# Patient Record
Sex: Male | Born: 1985 | Race: Black or African American | Hispanic: No | Marital: Single | Smoking: Current every day smoker
Health system: Southern US, Community
[De-identification: ages and names within clinical notes are randomized; demographics above are authoritative.]

---
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
## 2014-11-01 ENCOUNTER — Encounter (HOSPITAL_COMMUNITY): Payer: Self-pay | Admitting: *Deleted

## 2014-11-01 ENCOUNTER — Emergency Department (HOSPITAL_COMMUNITY)
Admission: EM | Admit: 2014-11-01 | Discharge: 2014-11-01 | Disposition: A | Discharge status: Home / Self Care | Payer: Self-pay | Attending: Emergency Medicine | Admitting: Emergency Medicine

## 2014-11-01 DIAGNOSIS — L03213 Periorbital cellulitis: Secondary | ICD-10-CM

## 2014-11-01 DIAGNOSIS — H109 Unspecified conjunctivitis: Secondary | ICD-10-CM | POA: Insufficient documentation

## 2014-11-01 DIAGNOSIS — H05012 Cellulitis of left orbit: Secondary | ICD-10-CM | POA: Insufficient documentation

## 2014-11-01 MED ORDER — ERYTHROMYCIN 5 MG/GM OP OINT: TOPICAL_OINTMENT | OPHTHALMIC | Status: DC

## 2014-11-01 MED ORDER — CEPHALEXIN 500 MG PO CAPS: 500.0000 mg | ORAL_CAPSULE | Freq: Four times a day (QID) | ORAL | Status: DC

## 2014-11-01 MED ORDER — SULFAMETHOXAZOLE-TRIMETHOPRIM 800-160 MG PO TABS: 2.0000 | ORAL_TABLET | Freq: Two times a day (BID) | ORAL | Status: DC

## 2014-11-01 NOTE — ED Provider Notes (Signed)
CSN: [id number]     Arrival date & time 11/01/14  0805 History   First MD Initiated Contact with Patient 11/01/14 [phone]     Chief Complaint  Patient presents with  . Eye Problem     (Consider location/radiation/quality/duration/timing/severity/associated sxs/prior Treatment) HPI Comments: Patient presents with a chief complaint of eye redness.  He reports that 2 days ago he woke up with redness of his left eye.  Yesterday the redness had progressed to the right eye.  He reports some associated yellow discharge and crusting of the eyes.  He has not tried any treatment prior to arrival.  He denies eye injury or foreign body.  He denies any eye pain, vision changes, fever, chills, nausea, or vomiting.  He does not wear contact lenses.  Patient is a 28 y.o. male presenting with eye problem. The history is provided by the patient.  Eye Problem   History reviewed. No pertinent past medical history. History reviewed. No pertinent past surgical history. History reviewed. No pertinent family history. History  Substance Use Topics  . Smoking status: Not on file  . Smokeless tobacco: Not on file  . Alcohol Use: Yes     Comment: occ    Review of Systems  All other systems reviewed and are negative.     Allergies  Review of patient's allergies indicates no known allergies.  Home Medications   Prior to Admission medications   Not on File   BP 134/73 mmHg  Pulse 88  Temp(Src) 97.8 F (36.6 C) (Oral)  Resp 18  Ht 5\' 6"  (1.676 m)  Wt 122 lb 3.2 oz (55.43 kg)  BMI 19.73 kg/m2  SpO2 100% Physical Exam  Constitutional: He appears well-developed and well-nourished.  HENT:  Head: Normocephalic and atraumatic.  Mouth/Throat: Oropharynx is clear and moist.  Eyes: EOM are normal. Pupils are equal, round, and reactive to light. Right eye exhibits no discharge. No foreign body present in the right eye. Left eye exhibits no discharge. No foreign body present in the left eye. Right  conjunctiva is injected. Left conjunctiva is injected.  Mild left periorbital swelling.  No pain with EOM.   Visual Acuity  Right Eye Distance: 15/20 Left Eye Distance: 15/20 Bilateral Distance: 15/20  Right Eye Near: R Near: 20/20 Left Eye Near:  L Near: 20/20 Bilateral Near:  20/20   Neck: Normal range of motion. Neck supple.  Cardiovascular: Normal rate, regular rhythm and normal heart sounds.   Pulmonary/Chest: Effort normal and breath sounds normal.  Musculoskeletal: Normal range of motion.  Neurological: He is alert.  Skin: Skin is warm and dry.  Psychiatric: He has a normal mood and affect.  Nursing note and vitals reviewed.   ED Course  Procedures (including critical care time) Labs Review Labs Reviewed - No data to display  Imaging Review No results found.   EKG Interpretation None      MDM   Final diagnoses:  None   Patient with signs and symptoms consistent with Acute Conjunctivitis.  No vision changes.  Normal visual acuity.  Will treat with Erythromycin ointment.  Patient also with mild periorbital swelling and erythema.  No proptosis. No pain with EOM.  He is afebrile.  Will treat for possible Periorbital Cellulitis.  Patient stable for discharge.  Given referral to Ophthalmology.  Return precautions given.      Santiago GladHeather Finnegan Gatta, PA-C 11/02/14 2227  Enid SkeensJoshua M Zavitz, MD 11/03/14 [phone]

## 2014-11-01 NOTE — ED Notes (Signed)
Pt reports redness and swelling to both eyes, more severe in left eye. Denies vision changes or itching.

## 2015-02-06 ENCOUNTER — Encounter (HOSPITAL_COMMUNITY): Payer: Self-pay | Admitting: Emergency Medicine

## 2015-02-06 ENCOUNTER — Emergency Department (HOSPITAL_COMMUNITY)
Admission: EM | Admit: 2015-02-06 | Discharge: 2015-02-06 | Disposition: A | Discharge status: Home / Self Care | Payer: Self-pay | Attending: Emergency Medicine | Admitting: Emergency Medicine

## 2015-02-06 DIAGNOSIS — R369 Urethral discharge, unspecified: Secondary | ICD-10-CM | POA: Insufficient documentation

## 2015-02-06 DIAGNOSIS — R300 Dysuria: Secondary | ICD-10-CM | POA: Insufficient documentation

## 2015-02-06 LAB — URINE MICROSCOPIC-ADD ON

## 2015-02-06 LAB — URINALYSIS, ROUTINE W REFLEX MICROSCOPIC
Bilirubin Urine: NEGATIVE
Glucose, UA: NEGATIVE mg/dL
Ketones, ur: NEGATIVE mg/dL
Nitrite: NEGATIVE
Protein, ur: NEGATIVE mg/dL
Specific Gravity, Urine: 1.021 (ref 1.005–1.030)
Urobilinogen, UA: 1 mg/dL (ref 0.0–1.0)
pH: 7.5 (ref 5.0–8.0)

## 2015-02-06 MED ORDER — DOXYCYCLINE HYCLATE 100 MG PO CAPS: 100.0000 mg | ORAL_CAPSULE | Freq: Two times a day (BID) | ORAL | Status: AC

## 2015-02-06 MED ORDER — AZITHROMYCIN 250 MG PO TABS
1000.0000 mg | ORAL_TABLET | Freq: Once | ORAL | Status: AC
Start: 2015-02-06 — End: 2015-02-06
  Administered 2015-02-06: 1000 mg via ORAL
  Filled 2015-02-06: qty 4

## 2015-02-06 MED ORDER — LIDOCAINE HCL (PF) 1 % IJ SOLN: 5.0000 mL | Freq: Once | INTRAMUSCULAR | Status: DC

## 2015-02-06 MED ORDER — CEFTRIAXONE SODIUM 250 MG IJ SOLR
250.0000 mg | Freq: Once | INTRAMUSCULAR | Status: AC
  Administered 2015-02-06: 250 mg via INTRAMUSCULAR
  Filled 2015-02-06: qty 250

## 2015-02-06 MED ORDER — LIDOCAINE HCL (PF) 1 % IJ SOLN
INTRAMUSCULAR | Status: AC
  Administered 2015-02-06: 5 mL
  Filled 2015-02-06: qty 5

## 2015-02-06 NOTE — Discharge Instructions (Signed)

## 2015-02-06 NOTE — ED Provider Notes (Signed)
CSN: [id number]     Arrival date & time 02/06/15  2038 History  This chart was scribed for non-physician practitioner, Fayrene Helper, PA-C working with Lorre Nick, MD by Angelene Giovanni, ED Scribe. The patient was seen in room TR10C/TR10C and the patient's care was started at 10:32 PM    Chief Complaint  Patient presents with  . Penile Discharge   The history is provided by the patient. No language interpreter was used.   HPI Comments: Clarence Matthews is a 29 y.o. male who presents to the Emergency Department complaining of penile discharge onset this morning when he woke up. He reports associated burning and painful urination onset yesterday. He also reports a left inguinal lump onset today. He denies any fever or urinary frequency. He denies similar symptoms in the past but states that he had an STD when he was 23 but does not remember which one. He denies any new sexual partners and adds that he has been with the same sexual partner for 2 years. He reports NKDA.   History reviewed. No pertinent past medical history. History reviewed. No pertinent past surgical history. No family history on file. History  Substance Use Topics  . Smoking status: Never Smoker   . Smokeless tobacco: Not on file  . Alcohol Use: Yes     Comment: occ    Review of Systems  Constitutional: Negative for fever.  Genitourinary: Positive for dysuria and discharge. Negative for frequency, penile swelling and scrotal swelling.      Allergies  Review of patient's allergies indicates no known allergies.  Home Medications   Prior to Admission medications   Medication Sig Start Date End Date Taking? Authorizing Provider  cephALEXin (KEFLEX) 500 MG capsule Take 1 capsule (500 mg total) by mouth 4 (four) times daily. Patient not taking: Reported on 02/06/2015 11/01/14   Santiago Glad, PA-C  erythromycin ophthalmic ointment Place a 1/2 inch ribbon of ointment into the lower eyelid.  Use four times daily for 5  days. Patient not taking: Reported on 02/06/2015 11/01/14   Santiago Glad, PA-C  sulfamethoxazole-trimethoprim (SEPTRA DS) 800-160 MG per tablet Take 2 tablets by mouth 2 (two) times daily. Patient not taking: Reported on 02/06/2015 11/01/14   Heather Laisure, PA-C   BP 132/81 mmHg  Pulse 67  Temp(Src) 98.9 F (37.2 C) (Oral)  Resp 14  Ht  (1.676 m)  Wt 130 lb (58.968 kg)  BMI 20.99 kg/m2  SpO2 100% Physical Exam  Constitutional: He is oriented to person, place, and time. He appears well-developed and well-nourished. No distress.  HENT:  Head: Normocephalic and atraumatic.  Eyes: Conjunctivae and EOM are normal.  Neck: Neck supple. No tracheal deviation present.  Cardiovascular: Normal rate.   Pulmonary/Chest: Effort normal. No respiratory distress.  Abdominal: Hernia confirmed negative in the right inguinal area and confirmed negative in the left inguinal area.  Genitourinary: Rectum normal. Right testis shows no swelling. Left testis shows no swelling. Circumcised. Discharge found.  Left inguinal lymphadenopathy    Testicle non tender Penis circumcised with active discharge No penile rash or legion.    Musculoskeletal: Normal range of motion.  Neurological: He is alert and oriented to person, place, and time.  Skin: Skin is warm and dry.  Psychiatric: He has a normal mood and affect. His behavior is normal.  Nursing note and vitals reviewed.   ED Course  Procedures (including critical care time)\ DIAGNOSTIC STUDIES: Oxygen Saturation is 100% on RA, normal by my interpretation.  COORDINATION OF CARE: 10:36 PM- Pt with penile discharge. Pt will be tested for an STD and advised to share results with sexual partner and urge to get tested if tested positive.  Avoid sexual activities until sxs resolved. Pt advised of plan for treatment and pt agrees.    Labs Review Labs Reviewed  URINALYSIS, ROUTINE W REFLEX MICROSCOPIC - Abnormal; Notable for the following:     APPearance CLOUDY (*)    Hgb urine dipstick TRACE (*)    Leukocytes, UA MODERATE (*)    All other components within normal limits  URINE MICROSCOPIC-ADD ON    Imaging Review No results found.   EKG Interpretation None      MDM   Final diagnoses:  Abnormal penile discharge    BP 132/81 mmHg  Pulse 67  Temp(Src) 98.9 F (37.2 C) (Oral)  Resp 14  Ht 5\' 6"  (1.676 m)  Wt 130 lb (58.968 kg)  BMI 20.99 kg/m2  SpO2 100%   I personally performed the services described in this documentation, which was scribed in my presence. The recorded information has been reviewed and is accurate.    Fayrene HelperBowie Kyelle Urbas, PA-C 02/06/15 2302  Lorre NickAnthony Allen, MD 02/09/15 [phone]

## 2015-02-06 NOTE — ED Notes (Signed)
Pt presents with painful urination yesterday, this morning he noticed white discharge from penis.  Denies new sexual partners.

## 2015-02-07 ENCOUNTER — Telehealth (HOSPITAL_BASED_OUTPATIENT_CLINIC_OR_DEPARTMENT_OTHER): Payer: Self-pay | Admitting: Emergency Medicine

## 2015-02-07 LAB — RPR: RPR: NONREACTIVE

## 2015-02-07 LAB — HIV ANTIBODY (ROUTINE TESTING W REFLEX): HIV SCREEN 4TH GENERATION: NONREACTIVE

## 2015-02-08 ENCOUNTER — Telehealth: Payer: Self-pay | Admitting: Surgery

## 2015-02-08 NOTE — Telephone Encounter (Signed)
Late Entry 02/08/15 18:57 ED CM [facility] on Doxy prescription. Discussed with Felicita GageJ. Geiple clarified frequency,  Read aloud 100 mg twice daily x 7 days. No further ED CM needs identified

## 2015-04-11 ENCOUNTER — Encounter (HOSPITAL_COMMUNITY): Payer: Self-pay | Admitting: *Deleted

## 2015-04-11 ENCOUNTER — Emergency Department (HOSPITAL_COMMUNITY)
Admission: EM | Admit: 2015-04-11 | Discharge: 2015-04-11 | Disposition: A | Discharge status: Home / Self Care | Payer: Self-pay | Attending: Emergency Medicine | Admitting: Emergency Medicine

## 2015-04-11 DIAGNOSIS — Z792 Long term (current) use of antibiotics: Secondary | ICD-10-CM | POA: Insufficient documentation

## 2015-04-11 DIAGNOSIS — Z202 Contact with and (suspected) exposure to infections with a predominantly sexual mode of transmission: Secondary | ICD-10-CM | POA: Insufficient documentation

## 2015-04-11 DIAGNOSIS — R369 Urethral discharge, unspecified: Secondary | ICD-10-CM | POA: Insufficient documentation

## 2015-04-11 DIAGNOSIS — Z720 Tobacco use: Secondary | ICD-10-CM | POA: Insufficient documentation

## 2015-04-11 MED ORDER — CEFTRIAXONE SODIUM 250 MG IJ SOLR
250.0000 mg | Freq: Once | INTRAMUSCULAR | Status: AC
  Administered 2015-04-11: 250 mg via INTRAMUSCULAR
  Filled 2015-04-11: qty 250

## 2015-04-11 MED ORDER — AZITHROMYCIN 250 MG PO TABS
1000.0000 mg | ORAL_TABLET | Freq: Once | ORAL | Status: AC
  Administered 2015-04-11: 1000 mg via ORAL
  Filled 2015-04-11: qty 4

## 2015-04-11 MED ORDER — LIDOCAINE HCL (PF) 1 % IJ SOLN
INTRAMUSCULAR | Status: AC
  Filled 2015-04-11: qty 5

## 2015-04-11 NOTE — ED Notes (Signed)
Pt. reports penile discharge onset this week , denies dysuria , no fever or chills.

## 2015-04-11 NOTE — ED Provider Notes (Signed)
CSN: [id number]     Arrival date & time 04/11/15  2045 History  This chart was scribed for Clarence DredgeEmily Ronnie Mallette, PA-C working with Clarence Lyonsouglas Delo, MD by Evon Slackerrance Branch, ED Scribe. This patient was seen in room TR05C/TR05C and the patient's care was started at 9:20 PM.      Chief Complaint  Patient presents with  . SEXUALLY TRANSMITTED DISEASE  . Penile Discharge   The history is provided by the patient. No language interpreter was used.   HPI Comments: Clarence LenzMario A Matthews is a 29 y.o. male who presents to the Emergency Department complaining of yellow penile discharge onset 2 days prior. Pt that his girlfriends recently told him she was diagnosed with chlamydia. Pt states that 1 month previously his girlfriend had been previously diagnosed with chlamydia and was never rechecked until a few days ago and was told she still had chlamydia.  Pt states that he has a Hx of chlamydia 1 month prior. Denies fevers, chills, dysuria, testicle pain, testicle swelling or abdominal pain.  States he took the antibiotics as directed and he and his girlfriend abstained from sex for about 1 week.    History reviewed. No pertinent past medical history. History reviewed. No pertinent past surgical history. History reviewed. No pertinent family history. History  Substance Use Topics  . Smoking status: Current Every Day Smoker  . Smokeless tobacco: Not on file  . Alcohol Use: Yes     Comment: occ    Review of Systems  Constitutional: Negative for fever and chills.  Gastrointestinal: Negative for nausea, vomiting and abdominal pain.  Genitourinary: Positive for discharge. Negative for dysuria, penile swelling, scrotal swelling, penile pain and testicular pain.  Musculoskeletal: Negative for myalgias and neck stiffness.  Skin: Negative for rash.  Allergic/Immunologic: Negative for immunocompromised state.  Hematological: Does not bruise/bleed easily.  Psychiatric/Behavioral: Negative for self-injury.      Allergies   Review of patient's allergies indicates no known allergies.  Home Medications   Prior to Admission medications   Medication Sig Start Date End Date Taking? Authorizing Provider  cephALEXin (KEFLEX) 500 MG capsule Take 1 capsule (500 mg total) by mouth 4 (four) times daily. Patient not taking: Reported on 02/06/2015 11/01/14   Santiago GladHeather Laisure, PA-C  doxycycline (VIBRAMYCIN) 100 MG capsule Take 1 capsule (100 mg total) by mouth 2 (two) times daily. One po bid x 7 days 02/06/15   Fayrene HelperBowie Tran, PA-C  erythromycin ophthalmic ointment Place a 1/2 inch ribbon of ointment into the lower eyelid.  Use four times daily for 5 days. Patient not taking: Reported on 02/06/2015 11/01/14   Santiago GladHeather Laisure, PA-C  sulfamethoxazole-trimethoprim (SEPTRA DS) 800-160 MG per tablet Take 2 tablets by mouth 2 (two) times daily. Patient not taking: Reported on 02/06/2015 11/01/14   Heather Laisure, PA-C   BP 125/78 mmHg  Pulse 63  Temp(Src) 98.8 F (37.1 C)  Resp 20  SpO2 100%   Physical Exam  Constitutional: He appears well-developed and well-nourished. No distress.  HENT:  Head: Normocephalic and atraumatic.  Eyes: Conjunctivae are normal.  Neck: Normal range of motion. Neck supple.  Pulmonary/Chest: Effort normal.  Musculoskeletal: Normal range of motion.  Neurological: He is alert. He exhibits normal muscle tone.  Skin: He is not diaphoretic.  Psychiatric: He has a normal mood and affect. His behavior is normal. Thought content normal.  Nursing note and vitals reviewed.   ED Course  Procedures (including critical care time) DIAGNOSTIC STUDIES: Oxygen Saturation is 100% on RA, normal by  my interpretation.    COORDINATION OF CARE: 9:52 PM-Discussed treatment plan with pt at bedside and pt agreed to plan.    Labs Review Labs Reviewed - No data to display  Imaging Review No results found.   EKG Interpretation None      MDM   Final diagnoses:  Abnormal penile discharge  Exposure to  chlamydia   Afebrile, nontoxic patient with known exposure to chlamydia - he and girlfriend both treated previously but girlfriend tested positive again.  Chart reviewed.  Pt had urine sent but no results for GC/Chlam available.  HIV, RPR negative.  Pt given rocephin, azithromycin, and doxycycline at home.  Given girlfriend's persistent positive chlamydia test, pt requests treatment.  Declines genital swab at this time.  Rocephin, azithromycin given in ED.   D/C home with health department follow up.  Discussed result, findings, treatment, and follow up  with patient.  Pt given return precautions.  Pt verbalizes understanding and agrees with plan.        I personally performed the services described in this documentation, which was scribed in my presence. The recorded information has been reviewed and is accurate.      Clarence Dredge, PA-C 04/11/15 2247  Clarence Lyons, MD 04/12/15 [phone]

## 2015-04-11 NOTE — ED Notes (Signed)
Pt A&Ox4, ambulatory at d/c with steady gait, NAD 

## 2015-04-11 NOTE — Discharge Instructions (Signed)
Read the information below.  You may return to the Emergency Department at any time for worsening condition or any new symptoms that concern you. ° ° °Sexually Transmitted Disease °A sexually transmitted disease (STD) is a disease or infection that may be passed (transmitted) from person to person, usually during sexual activity. This may happen by way of saliva, semen, blood, vaginal mucus, or urine. Common STDs include:  °· Gonorrhea.   °· Chlamydia.   °· Syphilis.   °· HIV and AIDS.   °· Genital herpes.   °· Hepatitis B and C.   °· Trichomonas.   °· Human papillomavirus (HPV).   °· Pubic lice.   °· Scabies. °· Mites. °· Bacterial vaginosis. °WHAT ARE CAUSES OF STDs? °An STD may be caused by bacteria, a virus, or parasites. STDs are often transmitted during sexual activity if one person is infected. However, they may also be transmitted through nonsexual means. STDs may be transmitted after:  °· Sexual intercourse with an infected person.   °· Sharing sex toys with an infected person.   °· Sharing needles with an infected person or using unclean piercing or tattoo needles. °· Having intimate contact with the genitals, mouth, or rectal areas of an infected person.   °· Exposure to infected fluids during birth. °WHAT ARE THE SIGNS AND SYMPTOMS OF STDs? °Different STDs have different symptoms. Some people may not have any symptoms. If symptoms are present, they may include:  °· Painful or bloody urination.   °· Pain in the pelvis, abdomen, vagina, anus, throat, or eyes.   °· A skin rash, itching, or irritation. °· Growths, ulcerations, blisters, or sores in the genital and anal areas. °· Abnormal vaginal discharge with or without bad odor.   °· Penile discharge in men.   °· Fever.   °· Pain or bleeding during sexual intercourse.   °· Swollen glands in the groin area.   °· Yellow skin and eyes (jaundice). This is seen with hepatitis.   °· Swollen testicles. °· Infertility. °· Sores and blisters in the mouth. °HOW ARE  STDs DIAGNOSED? °To make a diagnosis, your health care provider may:  °· Take a medical history.   °· Perform a physical exam.   °· Take a sample of any discharge to examine. °· Swab the throat, cervix, opening to the penis, rectum, or vagina for testing. °· Test a sample of your first morning urine.   °· Perform blood tests.   °· Perform a Pap test, if this applies.   °· Perform a colposcopy.   °· Perform a laparoscopy.   °HOW ARE STDs TREATED? ° Treatment depends on the STD. Some STDs may be treated but not cured.  °· Chlamydia, gonorrhea, trichomonas, and syphilis can be cured with antibiotic medicine.   °· Genital herpes, hepatitis, and HIV can be treated, but not cured, with prescribed medicines. The medicines lessen symptoms.   °· Genital warts from HPV can be treated with medicine or by freezing, burning (electrocautery), or surgery. Warts may come back.   °· HPV cannot be cured with medicine or surgery. However, abnormal areas may be removed from the cervix, vagina, or vulva.   °· If your diagnosis is confirmed, your recent sexual partners need treatment. This is true even if they are symptom-free or have a negative culture or evaluation. They should not have sex until their health care providers say it is okay. °HOW CAN I REDUCE MY RISK OF GETTING AN STD? °Take these steps to reduce your risk of getting an STD: °· Use latex condoms, dental dams, and water-soluble lubricants during sexual activity. Do not use petroleum jelly or oils. °· Avoid having multiple sex partners. °·   partners.  Do not have sex with someone who has other sex partners.  Do not have sex with anyone you do not know or who is at high risk for an STD.  Avoid risky sex practices that can break your skin.  Do not have sex if you have open sores on your mouth or skin.  Avoid drinking too much alcohol or taking illegal drugs. Alcohol and drugs can affect your judgment and put you in a vulnerable position.  Avoid engaging in oral and anal  sex acts.  Get vaccinated for HPV and hepatitis. If you have not received these vaccines in the past, talk to your health care provider about whether one or both might be right for you.   If you are at risk of being infected with HIV, it is recommended that you take a prescription medicine daily to prevent HIV infection. This is called pre-exposure prophylaxis (PrEP). You are considered at risk if:  You are a man who has sex with other men (MSM).  You are a heterosexual man or woman and are sexually active with more than one partner.  You take drugs by injection.  You are sexually active with a partner who has HIV.  Talk with your health care provider about whether you are at high risk of being infected with HIV. If you choose to begin PrEP, you should first be tested for HIV. You should then be tested every 3 months for as long as you are taking PrEP.  WHAT SHOULD I DO IF I THINK I HAVE AN STD?  See your health care provider.   Tell your sexual partner(s). They should be tested and treated for any STDs.  Do not have sex until your health care provider says it is okay. WHEN SHOULD I GET IMMEDIATE MEDICAL CARE? Contact your health care provider right away if:   You have severe abdominal pain.  You are a man and notice swelling or pain in your testicles.  You are a woman and notice swelling or pain in your vagina. Document Released: 01/22/2003 Document Revised: 11/06/2013 Document Reviewed: 05/22/2013 [facility] Patient Information 2015 [employer] [place]. This information is not intended to replace advice given to you by your health care provider. Make sure you discuss any questions you have with your health care provider.

## 2015-04-11 NOTE — ED Notes (Signed)
Pt c/o penile discharge. Pt states he was informed by his girl friend that she has chlaymidia

## 2020-06-23 ENCOUNTER — Emergency Department (HOSPITAL_COMMUNITY): Payer: Self-pay

## 2020-06-23 ENCOUNTER — Other Ambulatory Visit: Payer: Self-pay

## 2020-06-23 ENCOUNTER — Encounter (HOSPITAL_COMMUNITY): Payer: Self-pay

## 2020-06-23 ENCOUNTER — Emergency Department (HOSPITAL_COMMUNITY)
Admission: EM | Admit: 2020-06-23 | Discharge: 2020-06-23 | Disposition: A | Discharge status: Home / Self Care | Payer: Self-pay | Attending: Emergency Medicine | Admitting: Emergency Medicine

## 2020-06-23 DIAGNOSIS — S63280A Dislocation of proximal interphalangeal joint of right index finger, initial encounter: Secondary | ICD-10-CM | POA: Insufficient documentation

## 2020-06-23 DIAGNOSIS — S63259A Unspecified dislocation of unspecified finger, initial encounter: Secondary | ICD-10-CM

## 2020-06-23 DIAGNOSIS — Y998 Other external cause status: Secondary | ICD-10-CM | POA: Insufficient documentation

## 2020-06-23 DIAGNOSIS — W231XXA Caught, crushed, jammed, or pinched between stationary objects, initial encounter: Secondary | ICD-10-CM | POA: Insufficient documentation

## 2020-06-23 DIAGNOSIS — Y92810 Car as the place of occurrence of the external cause: Secondary | ICD-10-CM | POA: Insufficient documentation

## 2020-06-23 DIAGNOSIS — Y93E3 Activity, vacuuming: Secondary | ICD-10-CM | POA: Insufficient documentation

## 2020-06-23 MED ORDER — ACETAMINOPHEN 500 MG PO TABS
1000.0000 mg | ORAL_TABLET | Freq: Once | ORAL | Status: AC
  Administered 2020-06-23: 1000 mg via ORAL
  Filled 2020-06-23: qty 2

## 2020-06-23 MED ORDER — LIDOCAINE HCL (PF) 1 % IJ SOLN
20.0000 mL | Freq: Once | INTRAMUSCULAR | Status: AC
  Administered 2020-06-23: 20 mL
  Filled 2020-06-23: qty 20

## 2020-06-23 NOTE — ED Notes (Signed)
Pt reports his son accidentally shut his R index finger in the car door when they were vacuuming out the car.

## 2020-06-23 NOTE — ED Triage Notes (Addendum)
Pt reports his son slammed his finger in the car door approx 1.5 hours ago. Obvious swelling and deformity to his Right index finger

## 2020-06-23 NOTE — ED Provider Notes (Signed)
[facility] EMERGENCY DEPARTMENT Provider Note   CSN: [id number] Arrival date & time: 06/23/20  1144     History Chief Complaint  Patient presents with   Finger Injury    Clarence Matthews is a 34 y.o. male.  HPI Patient is a 34 year old male with no pertinent past medical history presented today for right index finger pain at the mid finger that occurred immediately after his finger was slammed in a car door at approximately 11 AM this morning.  He states the pain is been constant, worse with touch achy in quality nonradiating.  He states that he has no lacerations or cuts.  He denies any other injuries.  He states that he cannot move the finger very well.  He has good sensation however.    History reviewed. No pertinent past medical history.  There are no problems to display for this patient.   History reviewed. No pertinent surgical history.     History reviewed. No pertinent family history.  Social History   Tobacco Use   Smoking status: Current Every Day Smoker   Smokeless tobacco: Never Used  Substance Use Topics   Alcohol use: Yes    Comment: occ   Drug use: No    Home Medications Prior to Admission medications   Medication Sig Start Date End Date Taking? Authorizing Provider  cephALEXin (KEFLEX) 500 MG capsule Take 1 capsule (500 mg total) by mouth 4 (four) times daily. Patient not taking: Reported on 02/06/2015 11/01/14   Santiago Glad, PA-C  doxycycline (VIBRAMYCIN) 100 MG capsule Take 1 capsule (100 mg total) by mouth 2 (two) times daily. One po bid x 7 days 02/06/15   Fayrene Helper, PA-C  erythromycin ophthalmic ointment Place a 1/2 inch ribbon of ointment into the lower eyelid.  Use four times daily for 5 days. Patient not taking: Reported on 02/06/2015 11/01/14   Santiago Glad, PA-C  sulfamethoxazole-trimethoprim (SEPTRA DS) 800-160 MG per tablet Take 2 tablets by mouth 2 (two) times daily. Patient not taking: Reported on 02/06/2015  11/01/14   Santiago Glad, PA-C    Allergies    Patient has no known allergies.  Review of Systems   Review of Systems  Constitutional: Negative for chills and fever.  HENT: Negative for congestion.   Respiratory: Negative for shortness of breath.   Cardiovascular: Negative for chest pain.  Gastrointestinal: Negative for abdominal pain.  Musculoskeletal: Negative for neck pain.       Right index finger pain    Physical Exam Updated Vital Signs BP (!) 146/100 (BP Location: Right Arm)    Pulse 60    Temp 98.7 F (37.1 C) (Oral)    Resp 18    SpO2 100%   Physical Exam Vitals and nursing note reviewed.  Constitutional:      General: He is not in acute distress.    Appearance: Normal appearance. He is not ill-appearing.  HENT:     Head: Normocephalic and atraumatic.  Eyes:     General: No scleral icterus.       Right eye: No discharge.        Left eye: No discharge.     Conjunctiva/sclera: Conjunctivae normal.  Pulmonary:     Effort: Pulmonary effort is normal.     Breath sounds: No stridor.  Musculoskeletal:     Comments: Obvious deformity of the right index finger at the PIP with dorsal dislocation of the finger at the PIP joint.  Unable to range finger.  Skin:    General: Skin is warm.     Capillary Refill: Capillary refill takes less than 2 seconds.     Comments: Cap refill is less than 2 seconds.  Neurological:     Mental Status: He is alert and oriented to person, place, and time. Mental status is at baseline.     Comments: Sensation intact in all fingertips.     ED Results / Procedures / Treatments   Labs (all labs ordered are listed, but only abnormal results are displayed) Labs Reviewed - No data to display  EKG None  Radiology DG Hand Complete Right  Result Date: 06/23/2020 CLINICAL DATA:  Pain after car door slammed on second digit EXAM: RIGHT HAND - COMPLETE 3+ VIEW COMPARISON:  None. FINDINGS: Frontal, oblique, and lateral views were obtained.  There is dislocation at the second PIP joint with dorsal displacement of the middle and distal second phalanges with respect to the proximal phalanx. There is a small avulsion along the volar aspect of the distal most aspect of the second proximal phalanx. No other fractures or dislocations are appreciable. Joint spaces appear normal. No erosive change. IMPRESSION: 1. Dislocation at the second PIP joint with dorsal displacement of the middle and distal phalanges of the second digit compared to the proximal phalanx. 2. Small avulsion arising from the volar aspect of the distal aspect of second proximal phalanx. 3.  No other fracture or dislocation.  No appreciable arthropathy. Electronically Signed   By: Bretta Bang III M.D.   On: 06/23/2020 13:16    Procedures Reduction of dislocation  Date/Time: 06/23/2020 2:53 PM Performed by: Gailen Shelter, PA Authorized by: Gailen Shelter, PA  Local anesthesia used: yes Anesthesia: digital block  Anesthesia: Local anesthesia used: yes Local Anesthetic: lidocaine 1% without epinephrine Anesthetic total: 10 mL  Sedation: Patient sedated: no  Patient tolerance: patient tolerated the procedure well with no immediate complications Comments: Patient finger was prepped and cleaned.  Please see digital block procedure note for details of digital block.  Anesthesia was successfully obtained.  Patient agreed to proceed with relocation of dislocation of right index finger at the PIP.  It was dorsally displaced initially and was returned to normal anatomical position.  Celedonio Miyamoto Block  Date/Time: 06/23/2020 2:54 PM Performed by: Gailen Shelter, PA Authorized by: Gailen Shelter, PA   Consent:    Consent obtained:  Verbal   Consent given by:  Patient   Risks discussed:  Unsuccessful block, nerve damage, infection, intravenous injection and pain   Alternatives discussed:  No treatment, delayed treatment and referral Indications:    Indications:  Pain  relief and procedural anesthesia Location:    Body area:  Upper extremity   Upper extremity nerve blocked: digital block.   Laterality:  Right (index finger) Pre-procedure details:    Skin preparation:  Alcohol Procedure details (see MAR for exact dosages):    Block needle gauge:  25 G   Anesthetic injected:  Lidocaine 1% w/o epi   Steroid injected:  None   Additive injected:  None   Injection procedure:  Anatomic landmarks identified, incremental injection, negative aspiration for blood and anatomic landmarks palpated Post-procedure details:    Dressing:  Sterile dressing   Outcome:  Anesthesia achieved   Patient tolerance of procedure:  Tolerated well, no immediate complications   (including critical care time)  Medications Ordered in ED Medications  acetaminophen (TYLENOL) tablet 1,000 mg (1,000 mg Oral Given 06/23/20 1358)  lidocaine (PF) (XYLOCAINE)  1 % injection 20 mL (20 mLs Infiltration Given by Other 06/23/20 1358)    ED Course  I have reviewed the triage vital signs and the nursing notes.  Pertinent labs & imaging results that were available during my care of the patient were reviewed by me and considered in my medical decision making (see chart for details).    MDM Rules/Calculators/A&P                          Patient is a 39-year-old male presented today with right index finger pain after having his finger slammed in a car door.  Physical exam is notable for a good cap refill.  Good sensation.  Obvious deformity with dorsal displacement of middle and distal phalanges.  X-ray confirms dorsal dislocation of middle phalanx.  Transition my care physician patient he is agreeable to relocation of dislocation today.  Please see procedure note for full details.  Successful relocation of finger.  Patient was given 1 dose of Tylenol for pain which improved his pain.  His symptoms were significantly improved with relocation of the joint.  He was placed in splint and had  postreduction x-rays taken which show anatomical position of finger.  He will follow-up with hand surgery for reassessment.  Will wear splint until told to remove it by hand surgery.  Patient still has some effects of digital block after reduction however seems to be grossly distally neurovascularly intact and has good cap refill and movement.  I discussed this case with my attending physician who cosigned this note including patient's presenting symptoms, physical exam, and planned diagnostics and interventions. Attending physician stated agreement with plan or made changes to plan which were implemented.   Very small avulsion fracture noted on dorsum of proximal phalanx at the distal aspect.  Final Clinical Impression(s) / ED Diagnoses Final diagnoses:  Dislocation of finger, initial encounter  Dislocation of proximal interphalangeal joint of right index finger, initial encounter    Rx / DC Orders ED Discharge Orders    None       Gailen Shelter, [place] 06/23/20 1459    Pricilla Loveless, MD 06/23/20 1609

## 2020-06-23 NOTE — Discharge Instructions (Addendum)
Your right index finger was dislocated today.  There is a tiny avulsion fracture noted on the x-ray.  I am placing you in a metallic splint which you should continue to wear until you are seen by hand surgeon.  I provided you the information for hand surgeon in our system.  Please call to make an appointment with them.  Please use Tylenol and ibuprofen for pain.  I have provided you with the dosing information below.  Please use Tylenol or ibuprofen for pain.  You may use 600 mg ibuprofen every 6 hours or 1000 mg of Tylenol every 6 hours.  You may choose to alternate between the 2.  This would be most effective.  Not to exceed 4 g of Tylenol within 24 hours.  Not to exceed 3200 mg ibuprofen 24 hours.

## 2020-06-23 NOTE — ED Notes (Signed)
Pt verbalized understanding of discharge instructions. Follow up care and pain management reviewed, pt had no further questions. 

## 2020-06-23 NOTE — ED Notes (Signed)
Patient transported to X-ray 

## 2020-07-23 ENCOUNTER — Other Ambulatory Visit: Payer: Self-pay

## 2022-02-02 IMAGING — DX DG HAND COMPLETE 3+V*R*
4 series · 4 of 4 positions shown · non-contrast
Comparison: None.

CLINICAL DATA: Pain after car door slammed on second digit

EXAM:
RIGHT HAND - COMPLETE 3+ VIEW

[x hand pa right]
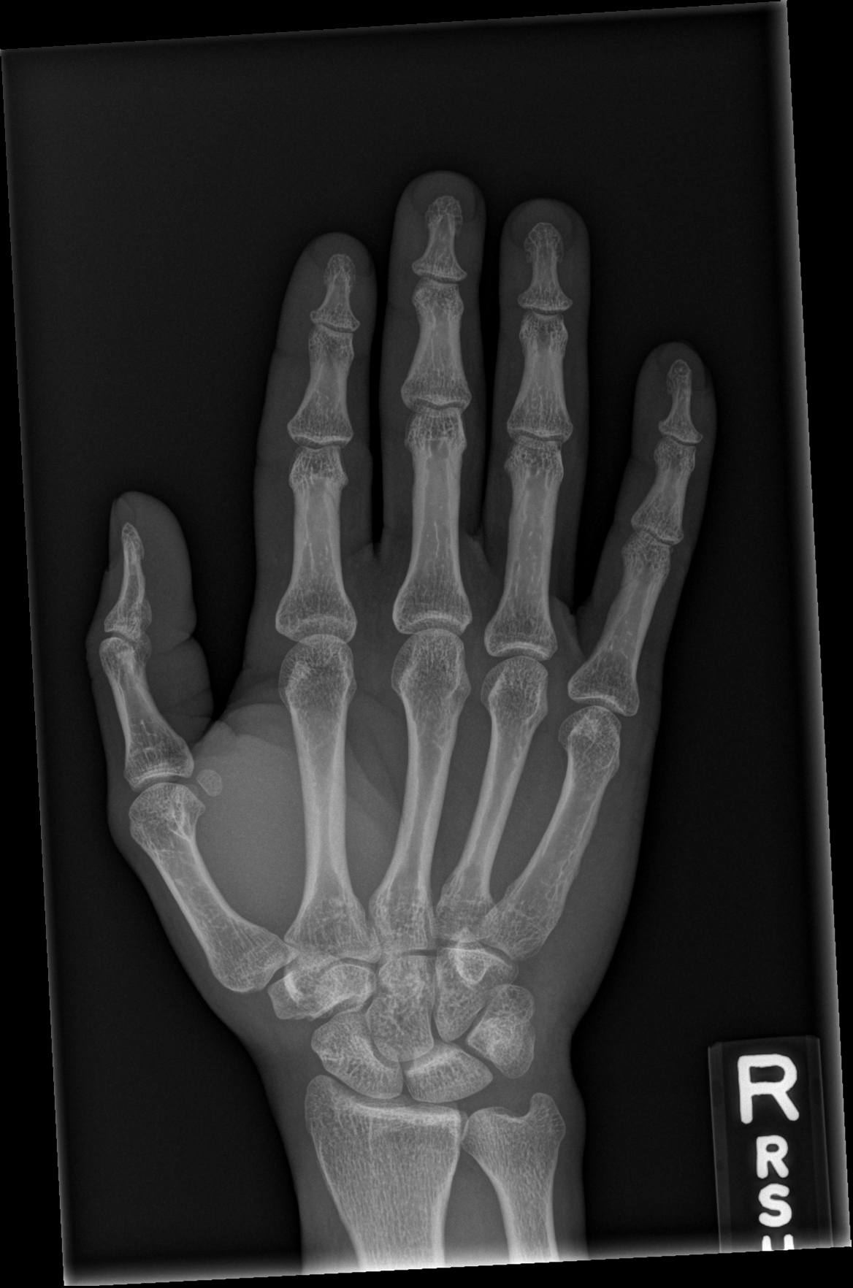

[x hand obl right]
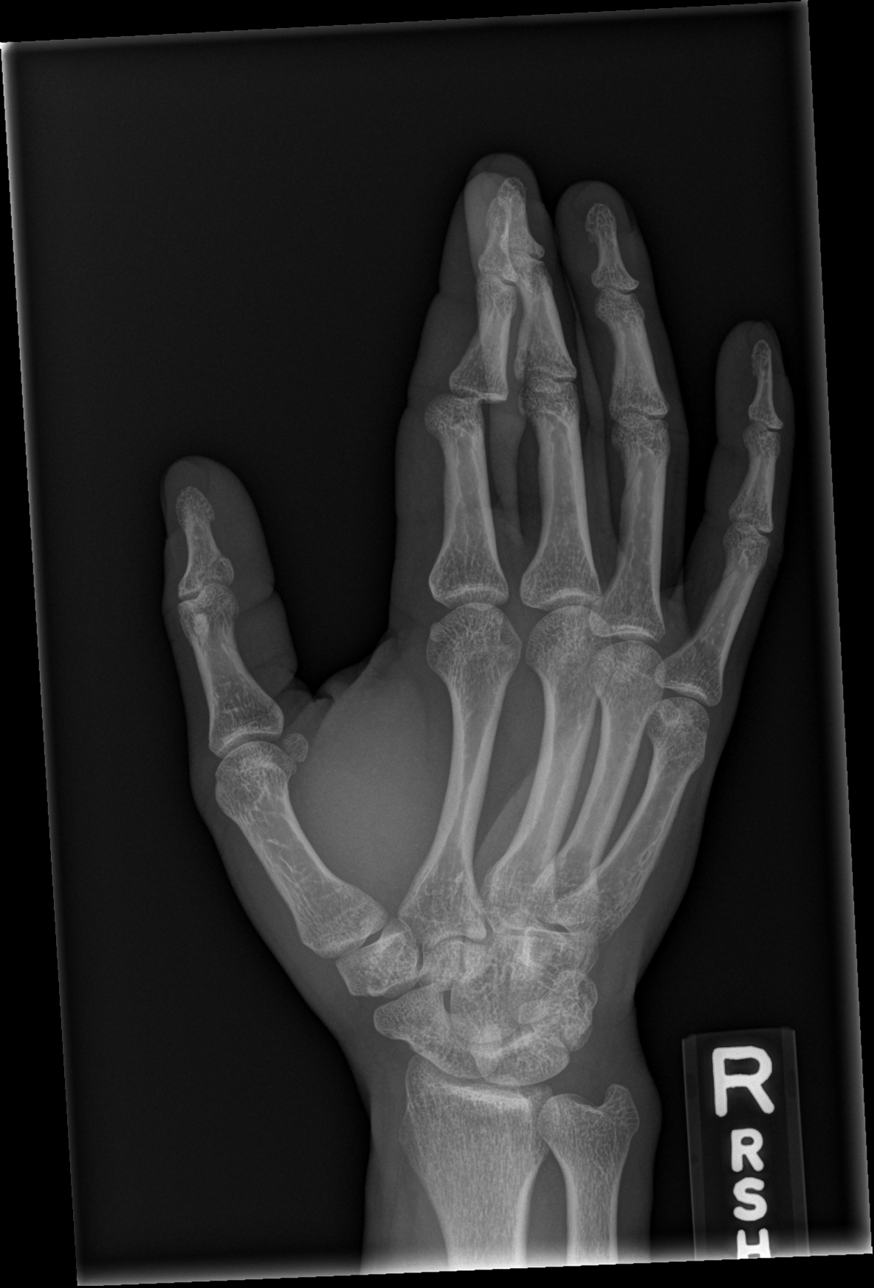

[x hand lat right (1 of 2)]
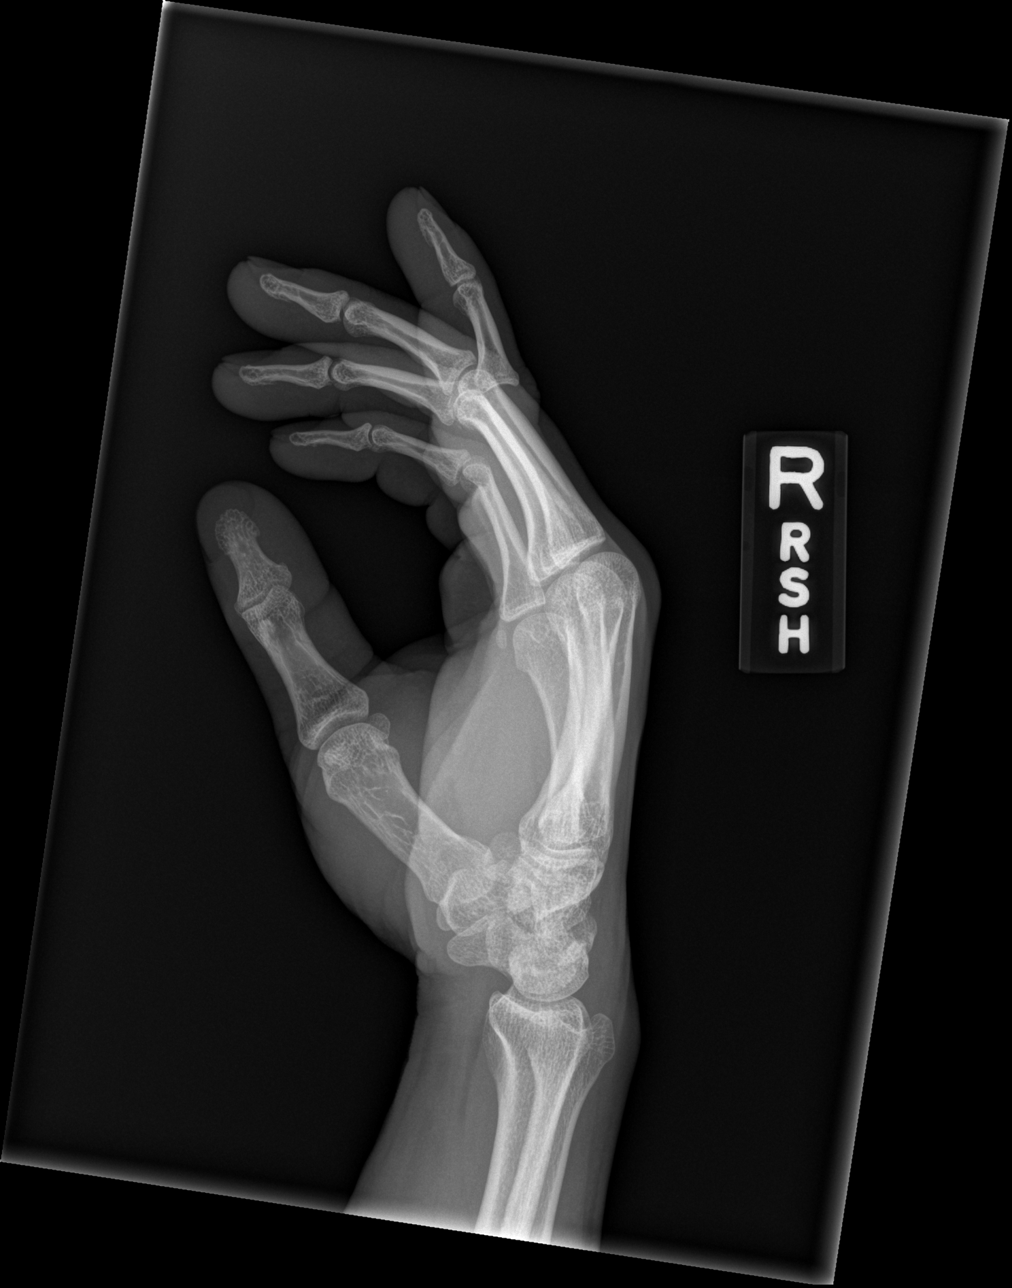

[x hand lat right (2 of 2)]
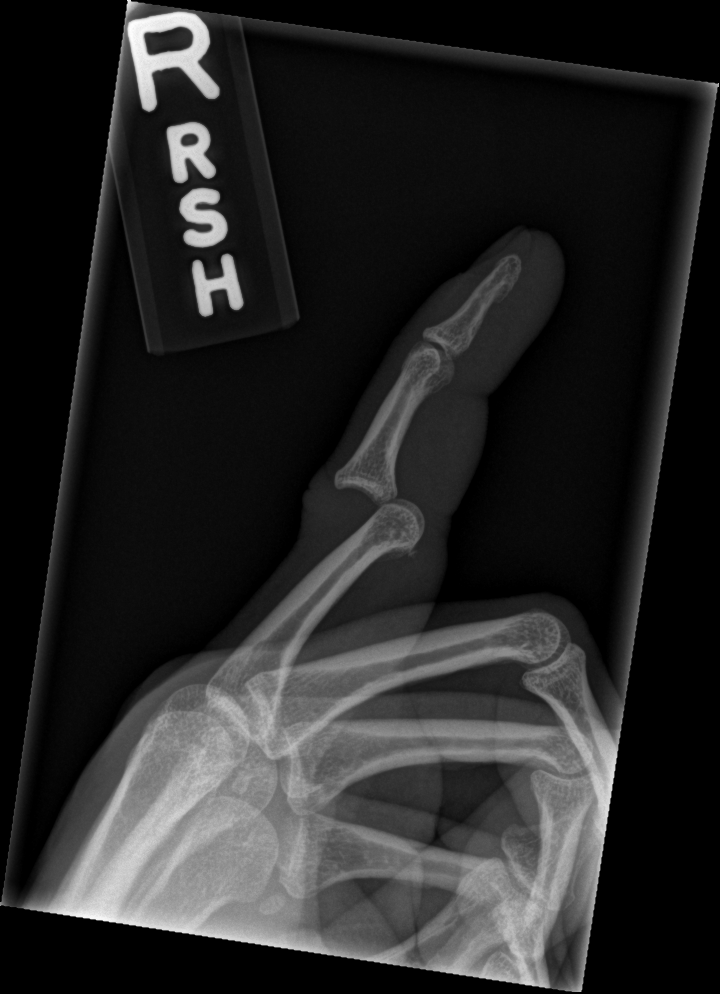

[4 of 4 positions shown; findings below may reference images not displayed]

FINDINGS: Frontal, oblique, and lateral views were obtained. There is
dislocation at the second PIP joint with dorsal displacement of the
middle and distal second phalanges with respect to the proximal
phalanx. There is a small avulsion along the volar aspect of the
distal most aspect of the second proximal phalanx. No other
fractures or dislocations are appreciable. Joint spaces appear
normal. No erosive change.
IMPRESSION: 1. Dislocation at the second PIP joint with dorsal displacement of
the middle and distal phalanges of the second digit compared to the
proximal phalanx.

2. Small avulsion arising from the volar aspect of the distal aspect
of second proximal phalanx.

3.  No other fracture or dislocation.  No appreciable arthropathy.

## 2024-10-17 ENCOUNTER — Ambulatory Visit
Admission: EM | Admit: 2024-10-17 | Discharge: 2024-10-17 | Disposition: A | Discharge status: Home / Self Care | Payer: Self-pay

## 2024-10-17 DIAGNOSIS — I10 Essential (primary) hypertension: Secondary | ICD-10-CM

## 2024-10-17 MED ORDER — AMLODIPINE BESYLATE 5 MG PO TABS: 5.0000 mg | ORAL_TABLET | Freq: Every day | ORAL | 0 refills | Status: AC

## 2024-10-17 NOTE — ED Triage Notes (Signed)
 Pt states his blood pressure was high yesterday when he was at another urgent care. States he has been feeling lightheaded. States he was told to F/U with his primary but his appointment is in 3 weeks.

## 2024-10-17 NOTE — ED Provider Notes (Signed)
 [facility]    CSN: [id number] Arrival date & time: 10/17/24  1538      History   Chief Complaint Chief Complaint  Patient presents with   Dizziness    HPI MICAH Matthews is a 38 y.o. male.   Patient presents today due to concerns due to hypertension.  Patient states that he was seen at an urgent care yesterday for a completely different chief complaint and noted that his blood pressure was 171/127 and states that when he went to the dentist the past several weeks ago his blood pressure was high there as well.  Patient states that hypertension runs in his family.  Patient denies a primary care doctor appointment when 12/24 and is hesitant to wait until his presented to follow-up.  The history is provided by the patient.  Dizziness   History reviewed. No pertinent past medical history.  There are no active problems to display for this patient.   History reviewed. No pertinent surgical history.     Home Medications    Prior to Admission medications   Medication Sig Start Date End Date Taking? Authorizing Provider  amLODipine (NORVASC) 5 MG tablet Take 1 tablet (5 mg total) by mouth daily. 10/17/24  Yes Andra Corean BROCKS, PA-C  doxycycline  (VIBRAMYCIN ) 100 MG capsule Take 1 capsule (100 mg total) by mouth 2 (two) times daily. One po bid x 7 days 02/06/15   Nivia Colon, PA-C    Family History History reviewed. No pertinent family history.  Social History Social History   Tobacco Use   Smoking status: Every Day   Smokeless tobacco: Never  Substance Use Topics   Alcohol use: Yes    Comment: occ   Drug use: No     Allergies   Patient has no known allergies.   Review of Systems Review of Systems  Neurological:  Positive for dizziness.     Physical Exam Triage Vital Signs ED Triage Vitals  Encounter Vitals Group     BP 10/17/24 1715 (!) 133/98     Girls Systolic BP Percentile --      Girls Diastolic BP Percentile --      Boys Systolic BP  Percentile --      Boys Diastolic BP Percentile --      Pulse Rate 10/17/24 1715 (!) 109     Resp 10/17/24 1715 16     Temp 10/17/24 1715 97.8 F (36.6 C)     Temp Source 10/17/24 1715 Oral     SpO2 10/17/24 1715 98 %     Weight --      Height --      Head Circumference --      Peak Flow --      Pain Score 10/17/24 1714 0     Pain Loc --      Pain Education --      Exclude from Growth Chart --    No data found.  Updated Vital Signs BP (!) 133/98 (BP Location: Left Arm)   Pulse (!) 109   Temp 97.8 F (36.6 C) (Oral)   Resp 16   SpO2 98%   Visual Acuity Right Eye Distance:   Left Eye Distance:   Bilateral Distance:    Right Eye Near:   Left Eye Near:    Bilateral Near:     Physical Exam Vitals and nursing note reviewed.  Constitutional:      General: He is not in acute distress.    Appearance:  Normal appearance. He is not ill-appearing, toxic-appearing or diaphoretic.  Eyes:     General: No scleral icterus. Cardiovascular:     Rate and Rhythm: Normal rate and regular rhythm.     Heart sounds: Normal heart sounds.  Pulmonary:     Effort: Pulmonary effort is normal. No respiratory distress.     Breath sounds: Normal breath sounds. No wheezing or rhonchi.  Skin:    General: Skin is warm.  Neurological:     Mental Status: He is alert and oriented to person, place, and time.  Psychiatric:        Mood and Affect: Mood normal.        Behavior: Behavior normal.      UC Treatments / Results  Labs (all labs ordered are listed, but only abnormal results are displayed) Labs Reviewed  CBC WITH DIFFERENTIAL/PLATELET  COMPREHENSIVE METABOLIC PANEL WITH GFR  LIPID PANEL    EKG   Radiology No results found.  Procedures Procedures (including critical care time)  Medications Ordered in UC Medications - No data to display  Initial Impression / Assessment and Plan / UC Course  I have reviewed the triage vital signs and the nursing notes.  Pertinent labs &  imaging results that were available during my care of the patient were reviewed by me and considered in my medical decision making (see chart for details).     Final Clinical Impressions(s) / UC Diagnoses   Final diagnoses:  Essential hypertension   Discharge Instructions   None    ED Prescriptions     Medication Sig Dispense Auth. Provider   amLODipine (NORVASC) 5 MG tablet Take 1 tablet (5 mg total) by mouth daily. 30 tablet Andra Corean BROCKS, PA-C      PDMP not reviewed this encounter.   Andra Corean BROCKS, PA-C 10/17/24 1742

## 2024-10-18 LAB — CBC WITH DIFFERENTIAL/PLATELET
Basophils Absolute: 0.1 x10E3/uL (ref 0.0–0.2)
Basos: 1 %
EOS (ABSOLUTE): 0.1 x10E3/uL (ref 0.0–0.4)
Eos: 2 %
Hematocrit: 49 % (ref 37.5–51.0)
Hemoglobin: 15.8 g/dL (ref 13.0–17.7)
Immature Grans (Abs): 0 x10E3/uL (ref 0.0–0.1)
Immature Granulocytes: 0 %
Lymphocytes Absolute: 1.4 x10E3/uL (ref 0.7–3.1)
Lymphs: 26 %
MCH: 26.7 pg (ref 26.6–33.0)
MCHC: 32.2 g/dL (ref 31.5–35.7)
MCV: 83 fL (ref 79–97)
Monocytes Absolute: 0.5 x10E3/uL (ref 0.1–0.9)
Monocytes: 10 %
Neutrophils Absolute: 3.4 x10E3/uL (ref 1.4–7.0)
Neutrophils: 61 %
Platelets: 474 x10E3/uL — ABNORMAL HIGH (ref 150–450)
RBC: 5.92 x10E6/uL — ABNORMAL HIGH (ref 4.14–5.80)
RDW: 12.2 % (ref 11.6–15.4)
WBC: 5.5 x10E3/uL (ref 3.4–10.8)

## 2024-10-18 LAB — LIPID PANEL
Chol/HDL Ratio: 2.2 ratio (ref 0.0–5.0)
Cholesterol, Total: 160 mg/dL (ref 100–199)
HDL: 73 mg/dL (ref >39)
LDL Chol Calc (NIH): 76 mg/dL (ref 0–99)
Triglycerides: 52 mg/dL (ref 0–149)
VLDL Cholesterol Cal: 11 mg/dL (ref 5–40)

## 2024-10-18 LAB — COMPREHENSIVE METABOLIC PANEL WITH GFR
ALT: 16 IU/L (ref 0–44)
AST: 24 IU/L (ref 0–40)
Albumin: 5 g/dL (ref 4.1–5.1)
Alkaline Phosphatase: 87 IU/L (ref 47–123)
BUN/Creatinine Ratio: 13 (ref 9–20)
BUN: 11 mg/dL (ref 6–20)
Bilirubin Total: 0.9 mg/dL (ref 0.0–1.2)
CO2: 21 mmol/L (ref 20–29)
Calcium: 10.2 mg/dL (ref 8.7–10.2)
Chloride: 97 mmol/L (ref 96–106)
Creatinine, Ser: 0.85 mg/dL (ref 0.76–1.27)
Globulin, Total: 3 g/dL (ref 1.5–4.5)
Glucose: 94 mg/dL (ref 70–99)
Potassium: 4.2 mmol/L (ref 3.5–5.2)
Sodium: 137 mmol/L (ref 134–144)
Total Protein: 8 g/dL (ref 6.0–8.5)
eGFR: 115 mL/min/1.73 (ref >59)

## 2024-10-19 ENCOUNTER — Ambulatory Visit (HOSPITAL_COMMUNITY): Payer: Self-pay
